# Patient Record
Sex: Female | Born: 1965 | Race: Black or African American | Marital: Single | Smoking: Never smoker
Health system: Southern US, Community
[De-identification: ages and names within clinical notes are randomized; demographics above are authoritative.]

## PROBLEM LIST (undated history)

## (undated) DIAGNOSIS — I10 Essential (primary) hypertension: Secondary | ICD-10-CM

## (undated) DIAGNOSIS — F329 Major depressive disorder, single episode, unspecified: Secondary | ICD-10-CM

## (undated) DIAGNOSIS — C801 Malignant (primary) neoplasm, unspecified: Secondary | ICD-10-CM

## (undated) DIAGNOSIS — F32A Depression, unspecified: Secondary | ICD-10-CM

## (undated) HISTORY — DX: Essential (primary) hypertension: I10

## (undated) HISTORY — DX: Depression, unspecified: F32.A

## (undated) HISTORY — DX: Malignant (primary) neoplasm, unspecified: C80.1

---
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
## 2019-08-20 ENCOUNTER — Ambulatory Visit (INDEPENDENT_AMBULATORY_CARE_PROVIDER_SITE_OTHER): Payer: Self-pay | Admitting: Surgery

## 2019-08-20 ENCOUNTER — Encounter: Payer: Self-pay | Admitting: Surgery

## 2019-08-20 VITALS — BP 138/95 | HR 70 | Ht 66.0 in | Wt 210.0 lb

## 2019-08-20 DIAGNOSIS — M5416 Radiculopathy, lumbar region: Secondary | ICD-10-CM

## 2019-08-20 DIAGNOSIS — M8588 Other specified disorders of bone density and structure, other site: Secondary | ICD-10-CM

## 2019-08-20 MED ORDER — METHYLPREDNISOLONE 4 MG PO TBPK: ORAL_TABLET | ORAL | 0 refills | Status: AC

## 2019-08-20 NOTE — Progress Notes (Signed)
   Office Visit Note   Patient: Sarah Curtis           Date of Birth: 1966-06-24           MRN: [id number] Visit Date: 08/20/2019              Requested by: No referring provider defined for this encounter. PCP: Jessee Avers, FNP   Assessment & Plan: Visit Diagnoses:  1. Radiculopathy, lumbar region   2. Osteopenia of lumbar spine     Plan: Sent in a prescription for Medrol Dosepak 6-day taper to be taken as rectal.  Follow-up in a few weeks for recheck.  Will decide if MRI is needed.  Follow-Up Instructions: Return in about 3 weeks (around 09/10/2019).   Orders:  No orders of the defined types were placed in this encounter.  Meds ordered this encounter  Medications   methylPREDNISolone (MEDROL DOSEPAK) 4 MG TBPK tablet    Sig: Take as directed.    Dispense:  21 tablet    Refill:  0      Procedures: No procedures performed   Clinical Data: No additional findings.   Subjective: Chief Complaint  Patient presents with   Lower Back - Pain    HPI  Review of Systems   Objective: Vital Signs: BP (!) 138/95   Pulse 70   Ht 5\' 6"  (1.676 m)   Wt 210 lb (95.3 kg)   BMI 33.89 kg/m   Physical Exam  Ortho Exam  Specialty Comments:  No specialty comments available.  Imaging: No results found.   PMFS History: Patient Active Problem List   Diagnosis Date Noted   Facet degeneration of lumbar region 10/15/2019   Past Medical History:  Diagnosis Date   Cancer ([place])    Depression    Hypertension     No family history on file.  No past surgical history on file. Social History   Occupational History   Not on file  Tobacco Use   Smoking status: Never   Smokeless tobacco: Never  Substance and Sexual Activity   Alcohol use: Yes   Drug use: Not on file   Sexual activity: Not on file

## 2019-09-10 ENCOUNTER — Ambulatory Visit (INDEPENDENT_AMBULATORY_CARE_PROVIDER_SITE_OTHER): Payer: Self-pay | Admitting: Orthopaedic Surgery

## 2019-09-10 ENCOUNTER — Encounter: Payer: Self-pay | Admitting: Orthopaedic Surgery

## 2019-09-10 ENCOUNTER — Ambulatory Visit (INDEPENDENT_AMBULATORY_CARE_PROVIDER_SITE_OTHER): Payer: Self-pay

## 2019-09-10 VITALS — BP 151/89 | HR 73 | Ht 66.0 in | Wt 210.0 lb

## 2019-09-10 DIAGNOSIS — M48062 Spinal stenosis, lumbar region with neurogenic claudication: Secondary | ICD-10-CM

## 2019-09-10 DIAGNOSIS — M545 Low back pain: Secondary | ICD-10-CM

## 2019-09-10 DIAGNOSIS — G8929 Other chronic pain: Secondary | ICD-10-CM

## 2019-09-10 NOTE — Progress Notes (Signed)
Office Visit Note   Patient: Sarah Curtis           Date of Birth: [birth year]           MRN: [id number] Visit Date: 09/10/2019              Requested by: Jessee Avers, [place],  [place] [id number] PCP: Jessee Avers, FNP   Assessment & Plan: Visit Diagnoses:  1. Chronic right-sided low back pain, unspecified whether sciatica present   2. Lumbar stenosis with neurogenic claudication     Plan: Patient has neurogenic claudication symptoms that are bothering her on a daily basis now problems at work problems getting her work done ambulating in the community.  She needs an MRI scan for evaluation and will return after the scan for review.  Follow-Up Instructions: Return after lumbar MRI scan.  Orders:  Orders Placed This Encounter  Procedures  . XR Lumbar Spine 2-3 Views  . MR Lumbar Spine w/o contrast   No orders of the defined types were placed in this encounter.     Procedures: No procedures performed   Clinical Data: No additional findings.   Subjective: Chief Complaint  Patient presents with  . 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  She has had problems with progressive back pain that is gotten worse over the last year.  She has had ongoing pain for multiple years but in the last few months of pain is progressed more and she has difficulty walking more than 1 or 2 blocks.  Problems in the grocery store unless she leans on a cart.  Problems at work standing she has to take a break and sit.  More pain radiating down her right leg than left leg.  She has not fallen.  No bowel bladder symptoms.  She is used ibuprofen, Aleve, Tylenol without relief.  She took steroids with improvement for several days and then recurrence of symptoms.  She has difficulty getting up after she sits.  No history of gout.  She has been on ibuprofen as well as tramadol to help with  the pain.  Review of Systems positive for low back pain with claudication symptoms.  Hypertension on HydroDIURIL.  Negative for stroke or MI.   Objective: Vital Signs: BP (!) 151/89   Pulse 73   Ht 5\' 6"  (1.676 m)   Wt 210 lb (95.3 kg)   BMI 33.89 kg/m   Physical Exam Constitutional:      Appearance: She is well-developed.  HENT:     Head: Normocephalic.     Right Ear: External ear normal.     Left Ear: External ear normal.  Eyes:     Pupils: Pupils are equal, round, and reactive to light.  Neck:     Thyroid: No thyromegaly.     Trachea: No tracheal deviation.  Cardiovascular:     Rate and Rhythm: Normal rate.  Pulmonary:     Effort: Pulmonary effort is normal.  Abdominal:     Palpations: Abdomen is soft.  Skin:    General: Skin is warm and dry.  Neurological:     Mental Status: She is alert and oriented to person, place, and time.  Psychiatric:        Behavior: Behavior normal.     Ortho Exam patient has intact anterior tib EHL negative logroll to the hips.  Distal pulses posterior tib right and left are intact and 2+.  No sensory deficit.  Knees reach full extension.  She has some sciatic notch tenderness. Specialty Comments:  No specialty comments available.  Imaging: Xr Lumbar Spine 2-3 Views  Result Date: 09/10/2019 AP lateral lumbar x-rays are obtained and reviewed including lateral flexion-extension x-ray.  This shows 3 mm anterolisthesis at L4-5 with disc space height maintained.  No listhesis at other levels. Impression: L4-5 spondylolisthesis without instability on flexion-extension x-rays.    PMFS History: Patient Active Problem List   Diagnosis Date Noted  . Lumbar stenosis with neurogenic claudication 09/10/2019   Past Medical History:  Diagnosis Date  . Cancer ([place])   . Depression   . Hypertension     History reviewed. No pertinent family history.  History reviewed. No pertinent surgical history. Social History   Occupational History  .  Not on file  Tobacco Use  . Smoking status: Never Smoker  . Smokeless tobacco: Never Used  Substance and Sexual Activity  . Alcohol use: Yes  . Drug use: Not on file  . Sexual activity: Not on file

## 2019-10-05 ENCOUNTER — Other Ambulatory Visit: Payer: Self-pay | Admitting: Orthopaedic Surgery

## 2019-10-06 ENCOUNTER — Ambulatory Visit
Admission: RE | Admit: 2019-10-06 | Discharge: 2019-10-06 | Disposition: A | Discharge status: Home / Self Care | Payer: Self-pay | Source: Ambulatory Visit | Attending: Orthopaedic Surgery | Admitting: Orthopaedic Surgery

## 2019-10-06 ENCOUNTER — Other Ambulatory Visit: Payer: Self-pay

## 2019-10-06 DIAGNOSIS — M545 Low back pain, unspecified: Secondary | ICD-10-CM

## 2019-10-06 DIAGNOSIS — G8929 Other chronic pain: Secondary | ICD-10-CM

## 2019-10-15 ENCOUNTER — Encounter: Payer: Self-pay | Admitting: Orthopaedic Surgery

## 2019-10-15 ENCOUNTER — Other Ambulatory Visit: Payer: Self-pay

## 2019-10-15 ENCOUNTER — Ambulatory Visit (INDEPENDENT_AMBULATORY_CARE_PROVIDER_SITE_OTHER): Payer: PRIVATE HEALTH INSURANCE | Admitting: Orthopaedic Surgery

## 2019-10-15 VITALS — BP 168/104 | HR 81 | Ht 66.0 in | Wt 210.0 lb

## 2019-10-15 DIAGNOSIS — M47816 Spondylosis without myelopathy or radiculopathy, lumbar region: Secondary | ICD-10-CM | POA: Insufficient documentation

## 2019-10-15 DIAGNOSIS — M48062 Spinal stenosis, lumbar region with neurogenic claudication: Secondary | ICD-10-CM

## 2019-10-15 NOTE — Progress Notes (Signed)
Office Visit Note   Patient: Sarah Curtis           Date of Birth: 1965-12-22           MRN: [id number] Visit Date: 10/15/2019              Requested by: Jessee Avers, [place],  [place] [id number] PCP: Jessee Avers, FNP   Assessment & Plan: Visit Diagnoses: Lumbar facet degeneration.   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  She is concerned about Covid exposure.  She requested a note for 2 days off from work which was given.  We discussed she needs to contact her PCP and get back on some medication for depression.  I think this is aggravating some of her lumbar spine symptoms and we reviewed images of her MRI and also the report she does not have any spinal stenosis that would be consistent with neurogenic claudication symptoms.  She has intact pulses and does not have arterial claudication.  We will set up for single epidural injection with Dr. Ernestina Patches.  I can follow-up after the holidays.  Follow-Up Instructions: Return in about 6 weeks (around 11/26/2019).   Orders:  Orders Placed This Encounter  Procedures  . Ambulatory referral to Physical Medicine Rehab   No orders of the defined types were placed in this encounter.     Procedures: No procedures performed   Clinical Data: No additional findings.   Subjective: Chief Complaint  Patient presents with  . 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  She states she has pain after she sits for a period of time has pain when she walks and can only walk 1-2 blocks.  Problems with bending and turning and twisting.  She had been on Cymbalta in the past but stopped it on her own.  She states she used to get multiple steroid injections monthly in the lumbar spine for symptoms and gain weight associated with this.  Sometimes they  would do 3 injections at once.  She denies associated bowel or bladder symptoms.  Pain is at the midline lumbar spine radiates equally into the buttocks does not really tend to go down her legs.  Review of Systems you of systems positive for depression, hypertension, chronic back pain otherwise negative no change from 09/10/2019 office visit.   Objective: Vital Signs: BP (!) 168/104   Pulse 81   Ht 5\' 6"  (1.676 m)   Wt 210 lb (95.3 kg)   BMI 33.89 kg/m   Physical Exam Constitutional:      Appearance: She is well-developed.  HENT:     Head: Normocephalic.     Right Ear: External ear normal.     Left Ear: External ear normal.  Eyes:     Pupils: Pupils are equal, round, and reactive to light.  Neck:     Thyroid: No thyromegaly.     Trachea: No tracheal deviation.  Cardiovascular:     Rate and Rhythm: Normal rate.  Pulmonary:     Effort: Pulmonary effort is normal.  Abdominal:     Palpations: Abdomen is soft.  Skin:    General: Skin is warm and dry.  Neurological:     Mental Status: She is alert and oriented to person, place, and time.  Psychiatric:        Behavior: Behavior normal.     Ortho Exam patient get from sitting to standing.  Normal heel toe gait.  Pulses are intact.  Mild trochanteric bursal tenderness.  Some sciatic notch tenderness right and left reflexes are 2+ and symmetrical.  Anterior tib is strong EHL is intact.  Specialty Comments:  No specialty comments available.  Imaging: MRI imaging from [facility] imaging on [employer] shows L4-5 grade 1 anterolisthesis with mild disc bulge moderate posterior element hypertrophy with some degenerative facet fluid.  No spinal lateral recess stenosis and borderline to mild L4 foraminal stenosis.  L5-S1 shows mild retrolisthesis with disc desiccation and mild disc bulge.  Posterior component hypertrophy without significant facet fluid.  No central lateral recess stenosis mild bilateral L5 foraminal stenosis.   PMFS  History: Patient Active Problem List   Diagnosis Date Noted  . Facet degeneration of lumbar region 10/15/2019   Past Medical History:  Diagnosis Date  . Cancer ([place])   . Depression   . Hypertension     History reviewed. No pertinent family history.  History reviewed. No pertinent surgical history. Social History   Occupational History  . Not on file  Tobacco Use  . Smoking status: Never Smoker  . Smokeless tobacco: Never Used  Substance and Sexual Activity  . Alcohol use: Yes  . Drug use: Not on file  . Sexual activity: Not on file

## 2019-11-10 ENCOUNTER — Encounter: Payer: PRIVATE HEALTH INSURANCE | Admitting: Physical Medicine and Rehabilitation

## 2021-03-13 IMAGING — MR MR LUMBAR SPINE W/O CM
5 series · 48 of 48 positions shown · non-contrast
Comparison: None.

CLINICAL DATA: 53-year-old female with low back pain radiating to
the bilateral buttocks and legs. Right leg numbness.

EXAM:
MRI LUMBAR SPINE WITHOUT CONTRAST
TECHNIQUE: Multiplanar, multisequence MR imaging of the lumbar spine was
performed. No intravenous contrast was administered.

[Series 3: T2 post-contrast · sagittal · 4.0mm · 0.88mm/px · 6 of 12 slices shown]
[im 1/12]
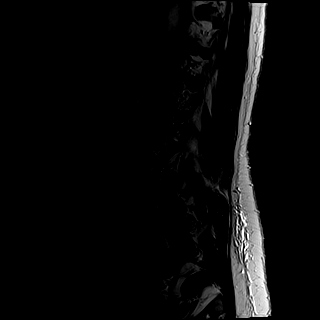
[im 3/12]
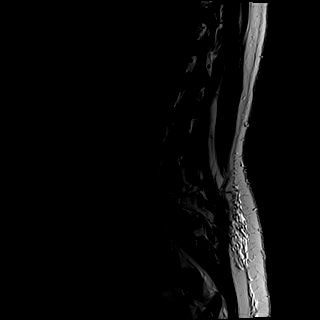
[im 5/12]
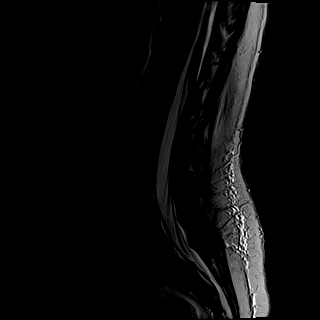
[im 7/12]
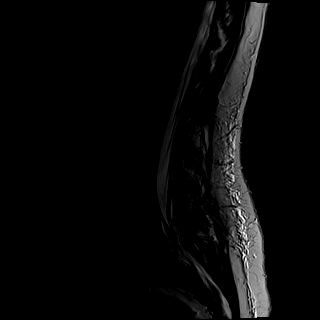
[im 9/12]
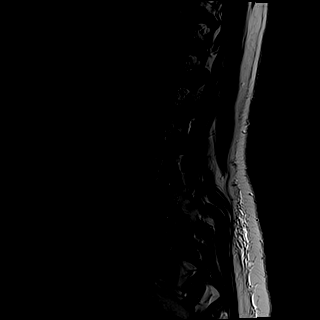
[im 12/12]
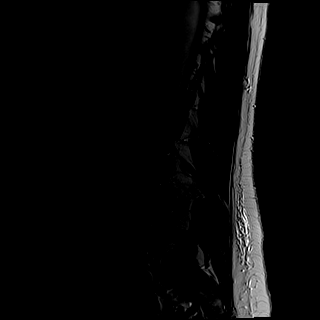

[Series 4: T1 · sagittal · 4.0mm · 0.88mm/px · 5 of 12 slices shown (1 of 2)]
[im 1/12]
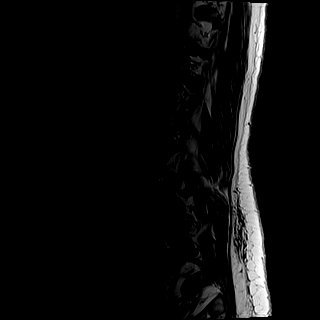
[im 3/12]
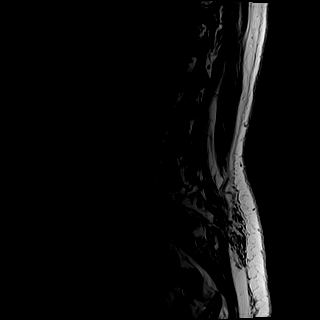
[im 6/12]
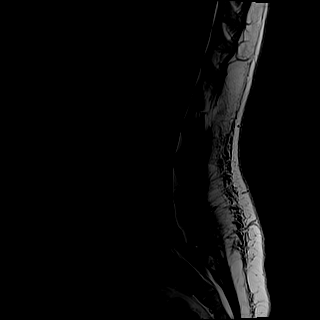
[im 9/12]
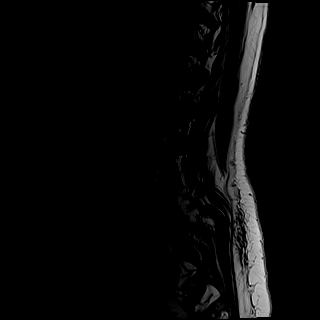
[im 12/12]
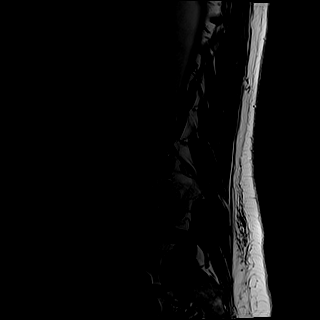

[Series 5: tirm sag · sagittal · 4.0mm · 0.55mm/px · 5 of 12 slices shown]
[im 1/12]
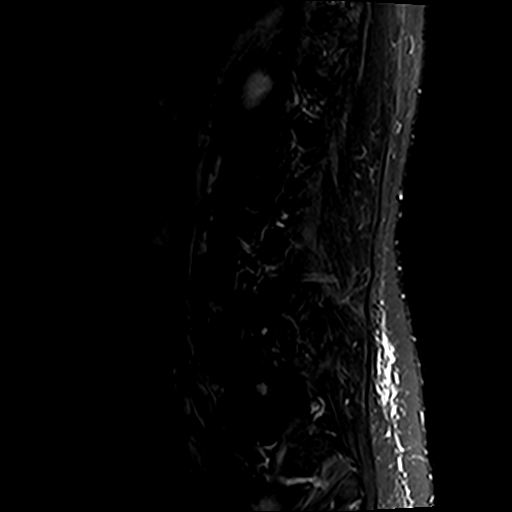
[im 3/12]
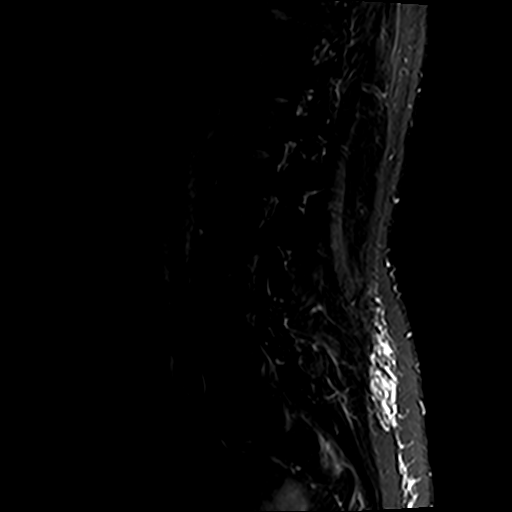
[im 6/12]
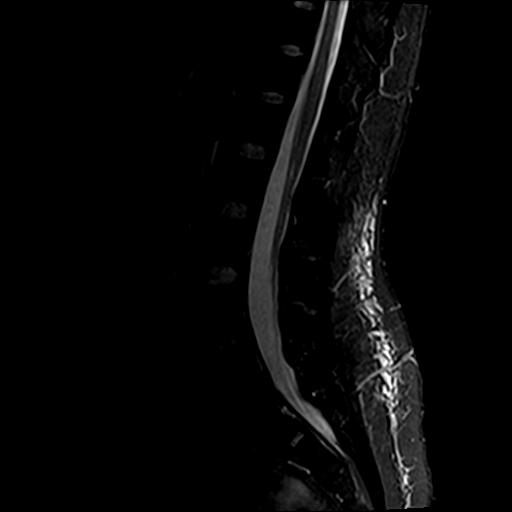
[im 9/12]
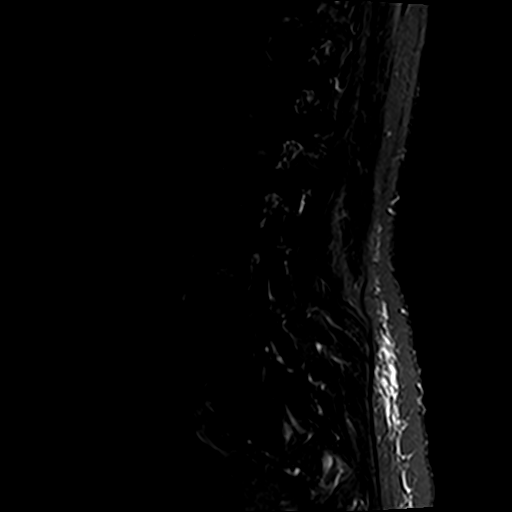
[im 12/12]
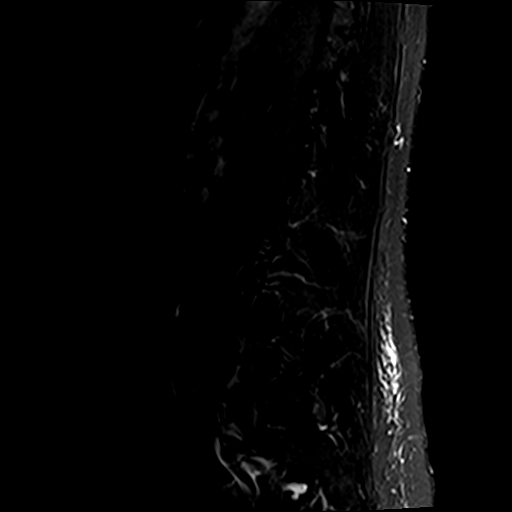

[Series 6: T1 · axial · 4.0mm · 0.78mm/px · z∈[-64,+165]mm · 16 of 39 slices shown (2 of 2)]
[im 1/39]
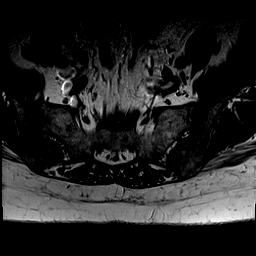
[im 3/39]
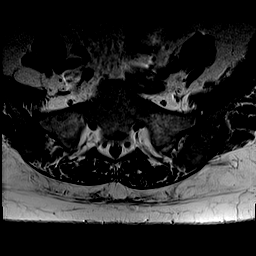
[im 6/39]
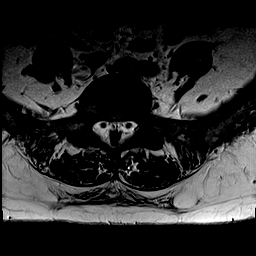
[im 8/39]
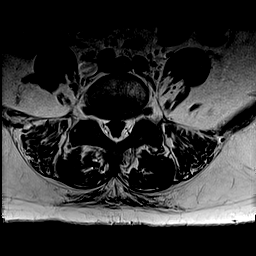
[im 11/39]
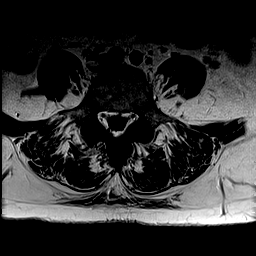
[im 13/39]
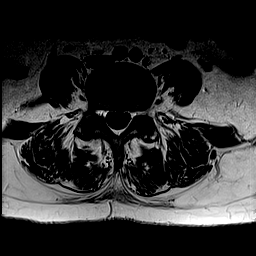
[im 16/39]
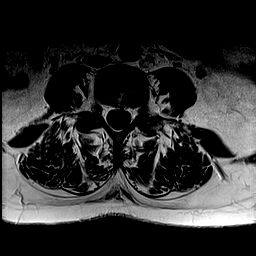
[im 18/39]
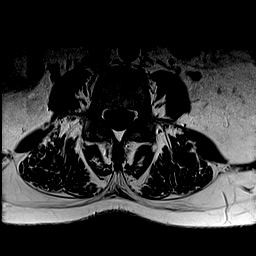
[im 21/39]
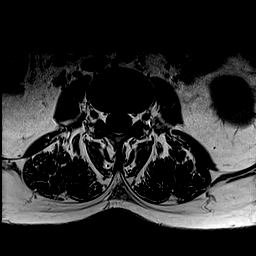
[im 23/39]
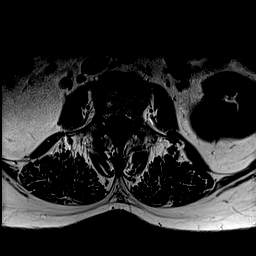
[im 26/39]
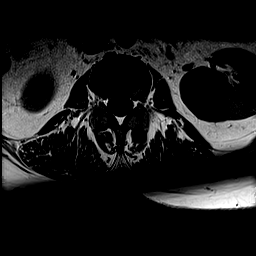
[im 28/39]
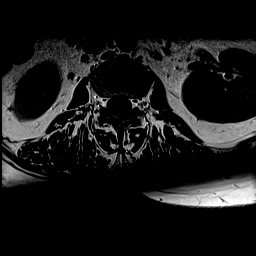
[im 31/39]
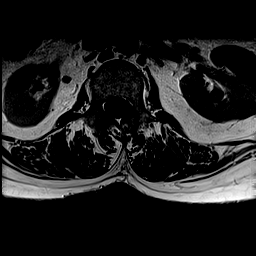
[im 33/39]
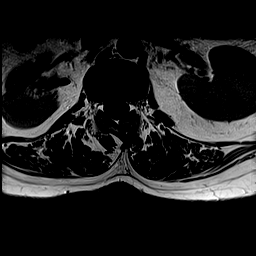
[im 36/39]
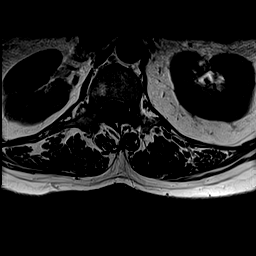
[im 39/39]
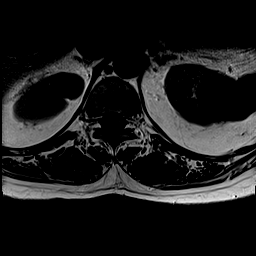

[Series 7: T2 · axial · 4.0mm · 1.04mm/px · z∈[-64,+165]mm · 16 of 39 slices shown]
[im 1/39]
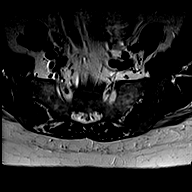
[im 3/39]
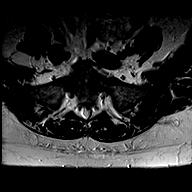
[im 6/39]
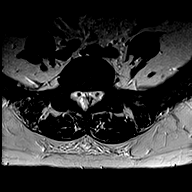
[im 8/39]
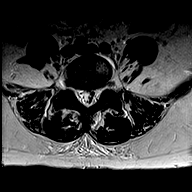
[im 11/39]
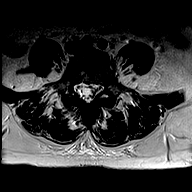
[im 13/39]
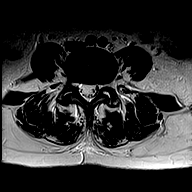
[im 16/39]
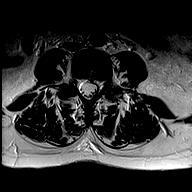
[im 18/39]
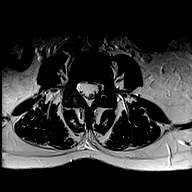
[im 21/39]
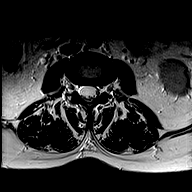
[im 23/39]
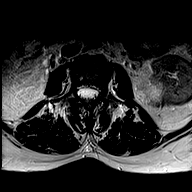
[im 26/39]
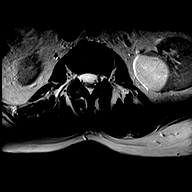
[im 28/39]
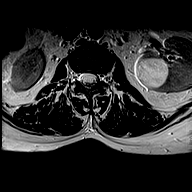
[im 31/39]
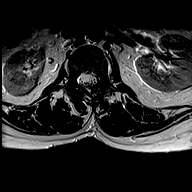
[im 33/39]
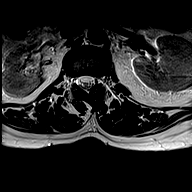
[im 36/39]
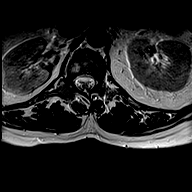
[im 39/39]
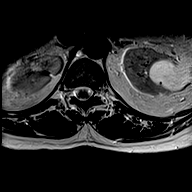

[48 of 48 positions shown; findings below may reference images not displayed]

FINDINGS: Segmentation: Lumbar segmentation appears to be normal and will be
designated as such for this report.

Alignment: Mild grade 1 anterolisthesis of L4 on L5 measures 3
millimeters. Subtle retrolisthesis of L5 on S1.

Vertebrae: No marrow edema or evidence of acute osseous abnormality.
Visualized bone marrow signal is within normal limits. Benign
vertebral hemangioma at L1 on the right.

Spina bifida occulta at L1 (series 6, image 7, normal variant).
Intact visible sacrum and SI joints.

Conus medullaris and cauda equina: Conus extends to the L1 level. No
lower spinal cord or conus signal abnormality.

Paraspinal and other soft tissues: Partially visible benign
appearing left renal cysts. Other visualized abdominal viscera and
paraspinal soft tissues are within normal limits.

Disc levels:

Negative visible lower thoracic levels through L3-L4.

L4-L5: Mild grade 1 anterolisthesis. Mild disc bulge and moderate
posterior element hypertrophy with degenerative facet joint fluid.
No spinal or lateral recess stenosis. Borderline to mild L4
foraminal stenosis.

L5-S1: Mild retrolisthesis. Disc desiccation and circumferential
disc bulge with broad-based posterior component. Moderate facet
hypertrophy and mild epidural lipomatosis. No convincing spinal or
lateral recess stenosis. Mild to moderate bilateral L5 foraminal
stenosis.
IMPRESSION: 1. Mild spondylolisthesis at L4-L5 and L5-S1 with degenerated
posterior elements, lesser disc degeneration. No spinal stenosis. Up
to mild L4 and moderate L5 neural foraminal stenosis.
2. Other lumbar and visible lower thoracic levels are negative; L1
spina bifida occulta (normal variant).
